# Patient Record
Sex: Male | Born: 1980
Health system: Southern US, Community
[De-identification: ages and names within clinical notes are randomized; demographics above are authoritative.]

## PROBLEM LIST (undated history)

## (undated) DIAGNOSIS — G935 Compression of brain: Secondary | ICD-10-CM

## (undated) DIAGNOSIS — J302 Other seasonal allergic rhinitis: Secondary | ICD-10-CM

## (undated) HISTORY — DX: Other seasonal allergic rhinitis: J30.2

## (undated) HISTORY — PX: OTHER SURGICAL HISTORY: SHX169

## (undated) HISTORY — DX: Compression of brain: G93.5

---

## 2004-04-15 HISTORY — PX: LEG OSTEOTOMY: SHX1001

## 2012-11-26 ENCOUNTER — Ambulatory Visit: Payer: Self-pay | Admitting: Family Medicine

## 2014-01-18 ENCOUNTER — Ambulatory Visit: Payer: Self-pay | Admitting: Ophthalmology

## 2014-06-07 ENCOUNTER — Ambulatory Visit: Payer: Self-pay | Admitting: Family Medicine

## 2014-12-27 ENCOUNTER — Encounter: Payer: Self-pay | Admitting: Family Medicine

## 2014-12-27 ENCOUNTER — Ambulatory Visit (INDEPENDENT_AMBULATORY_CARE_PROVIDER_SITE_OTHER): Payer: 59 | Admitting: Family Medicine

## 2014-12-27 VITALS — BP 102/60 | HR 72 | Temp 98.1°F | Resp 16 | Ht 60.0 in | Wt 173.0 lb

## 2014-12-27 DIAGNOSIS — K047 Periapical abscess without sinus: Secondary | ICD-10-CM

## 2014-12-27 MED ORDER — AMOXICILLIN-POT CLAVULANATE 875-125 MG PO TABS: 1.0000 | ORAL_TABLET | Freq: Two times a day (BID) | ORAL | Status: DC

## 2014-12-27 NOTE — Progress Notes (Signed)
Subjective:     Patient ID: Brent Ruiz, male   DOB: [birth year], 34 y.o.   MRN: [id number]  HPI  Chief Complaint  Patient presents with  . Oral Pain  States he noticed it first on 9/10 in his right upper molar area then noticed accompanying right facial pain. This has responded to Aleve. States he has had a tooth abscess in the past and it feels similar. States he has had frequent dental visits in the past for replacement of fillings and a root canal.   Review of Systems  Constitutional: Negative for fever and chills.       Objective:   Physical Exam  Constitutional: He appears well-developed and well-nourished. No distress.  HENT:  Mouth/Throat:    Tenderness and mild swelling of gum tissue around right upper posterior molar. No erythema or drainage noted.       Assessment:    1. Tooth abscess - amoxicillin-clavulanate (AUGMENTIN) 875-125 MG per tablet; Take 1 tablet by mouth 2 (two) times daily.  Dispense: 20 tablet; Refill: 0    Plan:   Recommended f/u with his dentist once swelling and pain has improved or sooner if worsening.

## 2014-12-27 NOTE — Patient Instructions (Signed)
Please update with your dentist regarding possible tooth abscess.

## 2015-03-28 ENCOUNTER — Encounter: Payer: Self-pay | Admitting: Family Medicine

## 2015-03-28 ENCOUNTER — Ambulatory Visit (INDEPENDENT_AMBULATORY_CARE_PROVIDER_SITE_OTHER): Payer: 59 | Admitting: Family Medicine

## 2015-03-28 VITALS — BP 90/50 | HR 89 | Temp 97.7°F | Resp 16 | Wt 172.2 lb

## 2015-03-28 DIAGNOSIS — J019 Acute sinusitis, unspecified: Secondary | ICD-10-CM | POA: Diagnosis not present

## 2015-03-28 DIAGNOSIS — J302 Other seasonal allergic rhinitis: Secondary | ICD-10-CM | POA: Insufficient documentation

## 2015-03-28 DIAGNOSIS — G935 Compression of brain: Secondary | ICD-10-CM | POA: Insufficient documentation

## 2015-03-28 DIAGNOSIS — M25511 Pain in right shoulder: Secondary | ICD-10-CM

## 2015-03-28 DIAGNOSIS — M25512 Pain in left shoulder: Secondary | ICD-10-CM

## 2015-03-28 MED ORDER — MELOXICAM 15 MG PO TABS: 15.0000 mg | ORAL_TABLET | Freq: Every day | ORAL | Status: DC

## 2015-03-28 MED ORDER — HYDROCODONE-HOMATROPINE 5-1.5 MG/5ML PO SYRP: ORAL_SOLUTION | ORAL | Status: DC

## 2015-03-28 MED ORDER — AMOXICILLIN-POT CLAVULANATE 875-125 MG PO TABS: 1.0000 | ORAL_TABLET | Freq: Two times a day (BID) | ORAL | Status: DC

## 2015-03-28 NOTE — Patient Instructions (Signed)
Continue decongestants/cold medication like Mucinex D. Delsym may also help hour cough during the day.

## 2015-03-28 NOTE — Progress Notes (Signed)
Subjective:     Patient ID: Brent Ruiz, male   DOB: 01/31/1981, 34 y.o.   MRN: [id number]  HPI  Chief Complaint  Patient presents with  . Sinus Problem    Patient comes in office today with concerns of sinus pain and pressure for the past 2 weeks. Patient reports productive cough of yellow/green phlegm, post nasal drip, and sinus headache. Patient reports that he is having a hard time sleeping through the night, ringing in the ears and has heaviness on his chest. He has tried otc Delsym and [facility] brand Sinus pressure tablet  Patient reports increased sinus headaches, purulent  post nasal drainage and accompanying cough   Review of Systems  Constitutional: Negative for fever and chills.  Musculoskeletal:       Has recurrence of shoulder pain for which he has seen orthopedics,Dr. Martha ClanKrasinski, in the past. Wishes to resume meloxicam.       Objective:   Physical Exam  Constitutional: He appears well-developed and well-nourished. No distress.  Ears: T.M's intact without inflammation Sinuses: non-tender Throat: no tonsillar enlargement or exudate Neck: no cervical adenopathy Lungs: clear     Assessment:    1. Acute sinusitis, recurrence not specified, unspecified location - amoxicillin-clavulanate (AUGMENTIN) 875-125 MG tablet; Take 1 tablet by mouth 2 (two) times daily.  Dispense: 20 tablet; Refill: 0 - HYDROcodone-homatropine (HYCODAN) 5-1.5 MG/5ML syrup; 5 ml 4-6 hours as needed for cough  Dispense: 240 mL; Refill: 0  2. Shoulder pain, bilateral - meloxicam (MOBIC) 15 MG tablet; Take 1 tablet (15 mg total) by mouth daily.  Dispense: 30 tablet; Refill: 1    Plan:    Will refer back to orthopedics if not improving.

## 2015-07-05 ENCOUNTER — Encounter: Payer: Self-pay | Admitting: Family Medicine

## 2015-07-05 ENCOUNTER — Ambulatory Visit (INDEPENDENT_AMBULATORY_CARE_PROVIDER_SITE_OTHER): Payer: 59 | Admitting: Family Medicine

## 2015-07-05 VITALS — BP 92/56 | HR 81 | Temp 97.9°F | Resp 16 | Wt 168.6 lb

## 2015-07-05 DIAGNOSIS — R1031 Right lower quadrant pain: Secondary | ICD-10-CM

## 2015-07-05 LAB — POCT URINALYSIS DIPSTICK
Bilirubin, UA: NEGATIVE
Glucose, UA: NEGATIVE
Ketones, UA: NEGATIVE
Leukocytes, UA: NEGATIVE
Nitrite, UA: NEGATIVE
PROTEIN UA: NEGATIVE
SPEC GRAV UA: 1.015
UROBILINOGEN UA: 0.2
pH, UA: 5

## 2015-07-05 NOTE — Patient Instructions (Signed)
Resume meloxicam and see it it helps.

## 2015-07-05 NOTE — Progress Notes (Signed)
Subjective:     Patient ID: Brent Ruiz, male   DOB: 08/23/1980, 35 y.o.   MRN: [id number]  HPI  Chief Complaint  Patient presents with  . Groin Pain    Patient comes in office today with concerns of groin pain on right side radiating down to the right testicle for the past week. Patient describes pain as sharp and notices it more when he is active or walking around.   States he feels the 2/10 pain 3 x day for 5 minutes.States the throbbing pain radiates to his right testicle. Denies injury. Has hx of kidney stone which he passed spontaneously.Not sexually active.   Review of Systems  Constitutional: Negative for fever and chills.  Gastrointestinal: Negative for constipation.  Genitourinary: Negative for dysuria.       Objective:   Physical Exam  Constitutional: He appears well-developed and well-nourished. No distress.  Abdominal: Soft. There is no tenderness.  Genitourinary:  Prostate non-tender, non enlarged. No testicle swelling or epididymal tenderness       Assessment:    1. Groin pain, right - POCT urinalysis dipstick    Plan:    Try meloxicam. If not improving consider x-ray.

## 2015-07-13 ENCOUNTER — Encounter: Payer: Self-pay | Admitting: Physician Assistant

## 2015-07-13 ENCOUNTER — Ambulatory Visit (INDEPENDENT_AMBULATORY_CARE_PROVIDER_SITE_OTHER): Payer: 59 | Admitting: Physician Assistant

## 2015-07-13 VITALS — BP 114/62 | HR 118 | Temp 102.8°F | Resp 18 | Wt 168.6 lb

## 2015-07-13 DIAGNOSIS — R6889 Other general symptoms and signs: Secondary | ICD-10-CM | POA: Diagnosis not present

## 2015-07-13 DIAGNOSIS — J029 Acute pharyngitis, unspecified: Secondary | ICD-10-CM | POA: Diagnosis not present

## 2015-07-13 DIAGNOSIS — J111 Influenza due to unidentified influenza virus with other respiratory manifestations: Secondary | ICD-10-CM | POA: Diagnosis not present

## 2015-07-13 LAB — POCT INFLUENZA A/B
INFLUENZA A, POC: NEGATIVE
Influenza B, POC: NEGATIVE

## 2015-07-13 LAB — POCT RAPID STREP A (OFFICE): Rapid Strep A Screen: NEGATIVE

## 2015-07-13 MED ORDER — OSELTAMIVIR PHOSPHATE 75 MG PO CAPS: 75.0000 mg | ORAL_CAPSULE | Freq: Two times a day (BID) | ORAL | Status: DC

## 2015-07-13 NOTE — Progress Notes (Signed)
Patient: Brent Ruiz Male    DOB: 07/30/1980   35 y.o.   MRN: [id number] Visit Date: 07/13/2015  Today's Provider: Margaretann LovelessJennifer M Burnette, PA-C   Chief Complaint  Patient presents with  . URI   Subjective:    URI  This is a new problem. The current episode started yesterday (Cough started yesterday evening together with the Fever). The problem has been gradually worsening. Maximum temperature: Didn't measured his temperature. Was just feeling cold and hot. The fever has been present for 1 to 2 days. Associated symptoms include congestion, coughing (phlegm from the cough had a "little blood in it last night from the sinuses"), headaches, joint pain (aching all over), a plugged ear sensation, rhinorrhea, sinus pain and a sore throat. Pertinent negatives include no abdominal pain, chest pain, diarrhea, dysuria, ear pain, nausea, neck pain, rash, sneezing, vomiting or wheezing. He has tried acetaminophen and decongestant (Ibuprofen) for the symptoms. The treatment provided no relief.   He has had his coworker at work be out sick over the last couple days with a fever as well. He was not told of his coworker had the flu or not. They do work in very close proximity.    Allergies  Allergen Reactions  . Eggs Or Egg-Derived Products Nausea Only   Previous Medications   MELOXICAM (MOBIC) 15 MG TABLET    Take 1 tablet (15 mg total) by mouth daily.    Review of Systems  Constitutional: Positive for fever, chills and fatigue.  HENT: Positive for congestion, postnasal drip, rhinorrhea, sinus pressure and sore throat. Negative for ear pain, sneezing and trouble swallowing.   Respiratory: Positive for cough (phlegm from the cough had a "little blood in it last night from the sinuses"). Negative for chest tightness, shortness of breath and wheezing.   Cardiovascular: Negative for chest pain, palpitations and leg swelling.  Gastrointestinal: Negative for nausea, vomiting, abdominal pain and  diarrhea.  Genitourinary: Negative for dysuria.  Musculoskeletal: Positive for myalgias, joint pain (aching all over) and arthralgias. Negative for neck pain and neck stiffness.  Skin: Negative for rash.  Neurological: Positive for headaches. Negative for dizziness.    Social History  Substance Use Topics  . Smoking status: Never Smoker   . Smokeless tobacco: Never Used  . Alcohol Use: No   Objective:   BP 114/62 mmHg  Pulse 118  Temp(Src) 102.8 F (39.3 C) (Oral)  Resp 18  Wt 168 lb 9.6 oz (76.476 kg)  SpO2 97%  Physical Exam  Constitutional: He appears well-developed and well-nourished. No distress.  HENT:  Head: Normocephalic and atraumatic.  Right Ear: Hearing, external ear and ear canal normal. Tympanic membrane is not erythematous and not bulging. A middle ear effusion is present.  Left Ear: Hearing, external ear and ear canal normal. Tympanic membrane is not erythematous and not bulging. A middle ear effusion is present.  Nose: Mucosal edema and rhinorrhea present. Right sinus exhibits no maxillary sinus tenderness and no frontal sinus tenderness. Left sinus exhibits no maxillary sinus tenderness and no frontal sinus tenderness.  Mouth/Throat: Uvula is midline and mucous membranes are normal. Posterior oropharyngeal erythema present. No oropharyngeal exudate or posterior oropharyngeal edema.  Eyes: Conjunctivae and EOM are normal. Pupils are equal, round, and reactive to light. Right eye exhibits no discharge. Left eye exhibits no discharge.  Neck: Normal range of motion. Neck supple. No tracheal deviation present. No Brudzinski's sign and no Kernig's sign noted. No thyromegaly present.  Cardiovascular:  Normal rate, regular rhythm and normal heart sounds.  Exam reveals no gallop and no friction rub.   No murmur heard. Pulmonary/Chest: Effort normal and breath sounds normal. No stridor. No respiratory distress. He has no wheezes. He has no rales.  Lymphadenopathy:    He has  no cervical adenopathy.  Skin: Skin is warm and dry. He is not diaphoretic.  Vitals reviewed.       Assessment & Plan:     1. Flu Worsening symptoms with high fever. I will treat with Tamiflu as below the regular flu test was negative today in the office secondary to symptoms and prevalence of flu in the area. I did advise him to continue alternating between Tylenol and ibuprofen for fevers. He said may sure to stay very well hydrated and get plenty of rest. He is to call the office if symptoms fail to improve or worsen. - oseltamivir (TAMIFLU) 75 MG capsule; Take 1 capsule (75 mg total) by mouth 2 (two) times daily.  Dispense: 10 capsule; Refill: 0  2. Sore throat Rapid strep test was negative. See above medical treatment plan. - POCT rapid strep A - oseltamivir (TAMIFLU) 75 MG capsule; Take 1 capsule (75 mg total) by mouth 2 (two) times daily.  Dispense: 10 capsule; Refill: 0  3. Flu-like symptoms Flu test was negative. See above medical treatment plan. - POCT Influenza A/B - oseltamivir (TAMIFLU) 75 MG capsule; Take 1 capsule (75 mg total) by mouth 2 (two) times daily.  Dispense: 10 capsule; Refill: 0       Margaretann Loveless, PA-C  [facility]

## 2015-07-13 NOTE — Patient Instructions (Signed)

## 2015-07-15 ENCOUNTER — Ambulatory Visit (INDEPENDENT_AMBULATORY_CARE_PROVIDER_SITE_OTHER): Payer: 59 | Admitting: Family Medicine

## 2015-07-15 ENCOUNTER — Encounter: Payer: Self-pay | Admitting: Family Medicine

## 2015-07-15 VITALS — BP 102/62 | HR 92 | Temp 99.8°F | Resp 16 | Wt 166.0 lb

## 2015-07-15 DIAGNOSIS — J019 Acute sinusitis, unspecified: Secondary | ICD-10-CM | POA: Insufficient documentation

## 2015-07-15 DIAGNOSIS — J302 Other seasonal allergic rhinitis: Secondary | ICD-10-CM | POA: Diagnosis not present

## 2015-07-15 DIAGNOSIS — R509 Fever, unspecified: Secondary | ICD-10-CM

## 2015-07-15 DIAGNOSIS — J0110 Acute frontal sinusitis, unspecified: Secondary | ICD-10-CM

## 2015-07-15 MED ORDER — AMOXICILLIN-POT CLAVULANATE 875-125 MG PO TABS
1.0000 | ORAL_TABLET | Freq: Two times a day (BID) | ORAL | Status: DC
Start: 2015-07-15 — End: 2015-08-08

## 2015-07-15 MED ORDER — FLUTICASONE PROPIONATE 50 MCG/ACT NA SUSP: 2.0000 | Freq: Every day | NASAL | Status: DC

## 2015-07-15 NOTE — Progress Notes (Signed)
Patient: Brent Ruiz Male    DOB: 04/19/1980   35 y.o.   MRN: [id number] Visit Date: 07/15/2015  Today's Provider: Lorie PhenixNancy Benicio Manna, MD   Chief Complaint  Patient presents with  . Cough  . Fever   Subjective:    HPI  Patient saw Brent Ruiz 07/13/15 for flu like symptoms. Treated for flu with tamiflu and advised to stay hydrated. Patient was advised to return if symptoms have not improved. Patient reports that he still has symptoms of body aches and fever 100.5.  Also, now with bloody nasal drainage and coughing up brown phlegm. Also, with headache on the top of his head.  Does have history of sinus infection. Concerned about the phlegm.  Also, has back pain with cough, not with breathing. Bilateral.    Allergies  Allergen Reactions  . Eggs Or Egg-Derived Products Nausea Only   Previous Medications   MELOXICAM (MOBIC) 15 MG TABLET    Take 1 tablet (15 mg total) by mouth daily.   OSELTAMIVIR (TAMIFLU) 75 MG CAPSULE    Take 1 capsule (75 mg total) by mouth 2 (two) times daily.    Review of Systems  Constitutional: Positive for fatigue. Negative for fever, chills and appetite change.  HENT: Positive for congestion, nosebleeds, postnasal drip, rhinorrhea, sinus pressure and sore throat.   Eyes: Negative for discharge.  Respiratory: Positive for cough. Negative for chest tightness, shortness of breath and wheezing.   Cardiovascular: Negative for chest pain and palpitations.  Gastrointestinal: Negative for nausea, vomiting and abdominal pain.    Social History  Substance Use Topics  . Smoking status: Never Smoker   . Smokeless tobacco: Never Used  . Alcohol Use: No   Objective:   BP 102/62 mmHg  Pulse 92  Temp(Src) 99.8 F (37.7 C) (Oral)  Resp 16  Wt 166 lb (75.297 kg)  SpO2 97%  Physical Exam  Constitutional: He is oriented to person, place, and time. He appears well-developed and well-nourished.  HENT:  Head: Normocephalic and atraumatic.  Right Ear:  Tympanic membrane and external ear normal.  Left Ear: Tympanic membrane and external ear normal.  Nose: Mucosal edema and rhinorrhea present. Right sinus exhibits no maxillary sinus tenderness and no frontal sinus tenderness. Left sinus exhibits no maxillary sinus tenderness and no frontal sinus tenderness.  Mouth/Throat: Uvula is midline and oropharynx is clear and moist.  Eyes: Conjunctivae and EOM are normal. Pupils are equal, round, and reactive to light.  Neck: Normal range of motion. Neck supple.  Cardiovascular: Normal rate and regular rhythm.   Pulmonary/Chest: Effort normal and breath sounds normal. He has no wheezes. He has no rales.  Neurological: He is alert and oriented to person, place, and time.      Assessment & Plan:     1. Acute frontal sinusitis, recurrence not specified New problem. Presumed secondary infection related to flu Will start antibiotic.  Patient instructed to call back if condition worsens or does not improve.    - amoxicillin-clavulanate (AUGMENTIN) 875-125 MG tablet; Take 1 tablet by mouth 2 (two) times daily.  Dispense: 20 tablet; Refill: 0  2. Other specified fever Suspect related to flu. Finish Tamiflu.   3. Allergic rhinitis, seasonal Is complaining of ear congestion and popping. Recommend start Flonase NS. OV if does not improve.   - fluticasone (FLONASE) 50 MCG/ACT nasal spray; Place 2 sprays into both nostrils daily.  Dispense: 16 g; Refill: 6     Lorie PhenixNancy Shekira Drummer, MD  [facility]

## 2015-08-08 ENCOUNTER — Ambulatory Visit (INDEPENDENT_AMBULATORY_CARE_PROVIDER_SITE_OTHER): Payer: 59 | Admitting: Family Medicine

## 2015-08-08 ENCOUNTER — Encounter: Payer: Self-pay | Admitting: Family Medicine

## 2015-08-08 VITALS — BP 104/64 | HR 81 | Temp 97.8°F | Resp 16 | Wt 165.6 lb

## 2015-08-08 DIAGNOSIS — J302 Other seasonal allergic rhinitis: Secondary | ICD-10-CM | POA: Diagnosis not present

## 2015-08-08 NOTE — Patient Instructions (Signed)
Start Claritin D and Flonase spray. Let me know if sinuses not improving by the end of the week. Use of a Neti Pot ok.

## 2015-08-08 NOTE — Progress Notes (Signed)
Subjective:     Patient ID: Brent Ruiz, male   DOB: 07/19/1980, 35 y.o.   MRN: [id number]  HPI  Chief Complaint  Patient presents with  . Sinus Problem    Patient comes in office today with concerns of sinus pain and pressure since 4/20. Associated with sinus pain patient reports; productive cough with phlegm, chest congestion, headache and bloody nose. Patient has been taking otc Mucinex and his prescription Hydrocodone.   States his eyes and throat are itchy: "I don't feel sick". Reports he usually has allergy symptoms this time of year. Sinus drainage and sputum are primarily clear.   Review of Systems     Objective:   Physical Exam  Constitutional: He appears well-developed and well-nourished. No distress.  Ears: T.M's intact without inflammation/increased cerumen in his left eary Throat: no tonsillar enlargement or exudate Neck: no cervical adenopathy Lungs: clear     Assessment:    1. Allergic rhinitis, seasonal    Plan:   Start Clartin D and Flonase spray. Add Neti Pot as tolerated. He is to let me know if sinuses not improving over the course of the week.

## 2015-11-14 ENCOUNTER — Ambulatory Visit: Payer: 59 | Admitting: Family Medicine

## 2015-11-16 ENCOUNTER — Telehealth: Payer: Self-pay

## 2015-11-16 ENCOUNTER — Encounter: Payer: Self-pay | Admitting: Family Medicine

## 2015-11-16 ENCOUNTER — Ambulatory Visit (INDEPENDENT_AMBULATORY_CARE_PROVIDER_SITE_OTHER): Payer: 59 | Admitting: Family Medicine

## 2015-11-16 ENCOUNTER — Ambulatory Visit
Admission: RE | Admit: 2015-11-16 | Discharge: 2015-11-16 | Disposition: A | Discharge status: Home / Self Care | Payer: 59 | Source: Ambulatory Visit | Attending: Family Medicine | Admitting: Family Medicine

## 2015-11-16 VITALS — BP 100/64 | HR 84 | Temp 97.9°F | Resp 16 | Wt 167.6 lb

## 2015-11-16 DIAGNOSIS — M25522 Pain in left elbow: Secondary | ICD-10-CM | POA: Diagnosis not present

## 2015-11-16 DIAGNOSIS — M25512 Pain in left shoulder: Secondary | ICD-10-CM | POA: Insufficient documentation

## 2015-11-16 DIAGNOSIS — K5900 Constipation, unspecified: Secondary | ICD-10-CM

## 2015-11-16 NOTE — Progress Notes (Signed)
Subjective:     Patient ID: Brent Ruiz, male   DOB: 03-24-1981, 35 y.o.   MRN: [id number]  HPI  Chief Complaint  Patient presents with  . Fall    Patient comes in office today with concerns of a fall that happened three weeks ago. Patient states that he was walking down stairs and slipping on rug on stairs falling and landing on his left arm. Patient reports elbow as being tender to the touch and difficulty lifting objects or doing simple task such as turning a door knob.   Also states the tip of his shoulder is sore as well. States he bumped down several carpeted steps when he fell. Also has occasional constipation and has tried the appropriate products like Benefiber and Miralax. Normal pattern is daily. Also has a spot on his testicle he wishes checking.   Review of Systems     Objective:   Physical Exam  Constitutional: He appears well-developed and well-nourished. No distress.  Genitourinary:  Genitourinary Comments: 1 mm skin tag noted on right testicle. No epididymal tenderness or difficulty retracting his foreskin.  Musculoskeletal:  Tender over his left olecranon and tip of his left acromion. EF/EE/Shoulder 5/5. Increased pain in his shoulder with active flexion > 90 degrees.Full PROM without pain. No elbow swelling noted.       Assessment:    1. Constipation, unspecified constipation type  2. Left elbow pain - DG Elbow Complete Left; Future  3. Left shoulder pain - DG Shoulder Left; Future    Plan:    Discussed scheduling nsaid's pending x-ray report. Continue current otc choices for constipation.

## 2015-11-16 NOTE — Telephone Encounter (Signed)
Let's see if both pains are helped by scheduling Aleve.

## 2015-11-16 NOTE — Patient Instructions (Addendum)
We will call you with the x-ray results. Try two Aleve twice daily with food for at least a week. Continue with fiber or Miralax for constipation.

## 2015-11-16 NOTE — Telephone Encounter (Signed)
Patient has been advised. KW 

## 2015-11-16 NOTE — Telephone Encounter (Signed)
-----   Message from Anola Gurney, [place] sent at 11/16/2015  9:50 AM EDT ----- No elbow fracture. L upper arm x-ray shows chronic deformity ? Old fracture. Have you hurt this arm before?

## 2015-11-16 NOTE — Telephone Encounter (Signed)
Spoke with patient on the phone and advised him of report, he states that he has no history of previous injury or fracture to left arm, just bursitis in his right

## 2015-12-06 DIAGNOSIS — R200 Anesthesia of skin: Secondary | ICD-10-CM | POA: Insufficient documentation

## 2015-12-06 DIAGNOSIS — N50819 Testicular pain, unspecified: Secondary | ICD-10-CM | POA: Insufficient documentation

## 2016-03-11 DIAGNOSIS — R102 Pelvic and perineal pain unspecified side: Secondary | ICD-10-CM | POA: Insufficient documentation

## 2016-12-19 ENCOUNTER — Ambulatory Visit (INDEPENDENT_AMBULATORY_CARE_PROVIDER_SITE_OTHER): Payer: 59 | Admitting: Family Medicine

## 2016-12-19 ENCOUNTER — Encounter: Payer: Self-pay | Admitting: Family Medicine

## 2016-12-19 VITALS — BP 96/60 | HR 68 | Temp 98.0°F | Resp 16 | Wt 147.0 lb

## 2016-12-19 DIAGNOSIS — J302 Other seasonal allergic rhinitis: Secondary | ICD-10-CM

## 2016-12-19 MED ORDER — CETIRIZINE HCL 10 MG PO TABS: 10.0000 mg | ORAL_TABLET | Freq: Every day | ORAL | 11 refills | Status: DC

## 2016-12-19 NOTE — Patient Instructions (Signed)
Allergic Rhinitis Allergic rhinitis is when the mucous membranes in the nose respond to allergens. Allergens are particles in the air that cause your body to have an allergic reaction. This causes you to release allergic antibodies. Through a chain of events, these eventually cause you to release histamine into the blood stream. Although meant to protect the body, it is this release of histamine that causes your discomfort, such as frequent sneezing, congestion, and an itchy, runny nose. What are the causes? Seasonal allergic rhinitis (hay fever) is caused by pollen allergens that may come from grasses, trees, and weeds. Year-round allergic rhinitis (perennial allergic rhinitis) is caused by allergens such as house dust mites, pet dander, and mold spores. What are the signs or symptoms?  Nasal stuffiness (congestion).  Itchy, runny nose with sneezing and tearing of the eyes. How is this diagnosed? Your health care provider can help you determine the allergen or allergens that trigger your symptoms. If you and your health care provider are unable to determine the allergen, skin or blood testing may be used. Your health care provider will diagnose your condition after taking your health history and performing a physical exam. Your health care provider may assess you for other related conditions, such as asthma, pink eye, or an ear infection. How is this treated? Allergic rhinitis does not have a cure, but it can be controlled by:  Medicines that block allergy symptoms. These may include allergy shots, nasal sprays, and oral antihistamines.  Avoiding the allergen. Hay fever may often be treated with antihistamines in pill or nasal spray forms. Antihistamines block the effects of histamine. There are over-the-counter medicines that may help with nasal congestion and swelling around the eyes. Check with your health care provider before taking or giving this medicine. If avoiding the allergen or the  medicine prescribed do not work, there are many new medicines your health care provider can prescribe. Stronger medicine may be used if initial measures are ineffective. Desensitizing injections can be used if medicine and avoidance does not work. Desensitization is when a patient is given ongoing shots until the body becomes less sensitive to the allergen. Make sure you follow up with your health care provider if problems continue. Follow these instructions at home: It is not possible to completely avoid allergens, but you can reduce your symptoms by taking steps to limit your exposure to them. It helps to know exactly what you are allergic to so that you can avoid your specific triggers. Contact a health care provider if:  You have a fever.  You develop a cough that does not stop easily (persistent).  You have shortness of breath.  You start wheezing.  Symptoms interfere with normal daily activities. This information is not intended to replace advice given to you by your health care provider. Make sure you discuss any questions you have with your health care provider. Document Released: 12/25/2000 Document Revised: 12/01/2015 Document Reviewed: 12/07/2012 Elsevier Interactive Patient Education  2017 [employer]  

## 2016-12-19 NOTE — Assessment & Plan Note (Signed)
Symptoms c/w allergic rhinitis No evidence of acute bacterial sinusitis, pneumonia, or AOM Discussed regular use of flonase and 2nd gen antihistamine May also use benadryl prn Can use cepachol for throat irritation Return precautions discussed

## 2016-12-19 NOTE — Progress Notes (Signed)
Patient: Brent Ruiz Male    DOB: November 28, 1980   36 y.o.   MRN: [id number] Visit Date: 12/19/2016  Today's Provider: Shirlee Latch, MD   Chief Complaint  Patient presents with  . Sinusitis   Subjective:    Sinusitis  This is a new problem. Episode onset: x 1 week. The problem is unchanged. There has been no fever. He is experiencing no pain. Associated symptoms include congestion, coughing (has noticed green sputum as well as blood), headaches, a hoarse voice, sneezing, a sore throat (related to post nasal drip) and swollen glands. Pertinent negatives include no chills, diaphoresis, ear pain, neck pain, shortness of breath or sinus pressure. (Post nasal drip) Treatments tried: Flonase, DayQuil Cold and Flu, Cepacol cough drops, honey with lime and ginger  The treatment provided mild relief.   Worse at night No known sick contacts Wonders if this may be related to buying new car and previous owner was a smoker. Does have seasonal allergies    Allergies  Allergen Reactions  . Eggs Or Egg-Derived Products Nausea Only     Current Outpatient Prescriptions:  .  fluticasone (FLONASE) 50 MCG/ACT nasal spray, Place 2 sprays into both nostrils daily., Disp: 16 g, Rfl: 6  Review of Systems  Constitutional: Negative for chills and diaphoresis.  HENT: Positive for congestion, hoarse voice, sneezing and sore throat (related to post nasal drip). Negative for ear pain and sinus pressure.   Respiratory: Positive for cough (has noticed green sputum as well as blood). Negative for shortness of breath.   Musculoskeletal: Negative for neck pain.  Neurological: Positive for headaches.    Social History  Substance Use Topics  . Smoking status: Never Smoker  . Smokeless tobacco: Never Used  . Alcohol use No   Objective:   BP 96/60 (BP Location: Left Arm, Patient Position: Sitting, Cuff Size: Normal)   Pulse 68   Temp 98 F (36.7 C) (Oral)   Resp 16   Wt 147 lb (66.7 kg)   BMI 28.71  kg/m  Vitals:   12/19/16 0818  BP: 96/60  Pulse: 68  Resp: 16  Temp: 98 F (36.7 C)  TempSrc: Oral  Weight: 147 lb (66.7 kg)     Physical Exam  Constitutional: He is oriented to person, place, and time. He appears well-developed and well-nourished. No distress.  HENT:  Head: Normocephalic and atraumatic.  Right Ear: Tympanic membrane, external ear and ear canal normal.  Left Ear: Tympanic membrane, external ear and ear canal normal.  Nose: Mucosal edema present. Right sinus exhibits no maxillary sinus tenderness and no frontal sinus tenderness. Left sinus exhibits no maxillary sinus tenderness and no frontal sinus tenderness.  Mouth/Throat: Uvula is midline and mucous membranes are normal. Posterior oropharyngeal erythema (mild) present. No oropharyngeal exudate.  Eyes: Pupils are equal, round, and reactive to light. Conjunctivae are normal. No scleral icterus.  Neck: Neck supple. No thyromegaly present.  Cardiovascular: Normal rate, regular rhythm, normal heart sounds and intact distal pulses.   No murmur heard. Pulmonary/Chest: Effort normal and breath sounds normal. No respiratory distress. He has no wheezes. He has no rales.  Musculoskeletal: He exhibits no edema or deformity.  Lymphadenopathy:    He has no cervical adenopathy.  Neurological: He is alert and oriented to person, place, and time.  Skin: Skin is warm and dry. No rash noted.  Psychiatric: He has a normal mood and affect. His behavior is normal.  Vitals reviewed.      Assessment &  Plan:     Allergic rhinitis, seasonal Symptoms c/w allergic rhinitis No evidence of acute bacterial sinusitis, pneumonia, or AOM Discussed regular use of flonase and 2nd gen antihistamine May also use benadryl prn Can use cepachol for throat irritation Return precautions discussed      The entirety of the information documented in the History of Present Illness, Review of Systems and Physical Exam were personally obtained by  me. Portions of this information were initially documented by Irving BurtonEmily Ratchford, CMA and reviewed by me for thoroughness and accuracy.     Shirlee LatchAngela Sharleen Szczesny, MD  [facility]

## 2017-03-20 ENCOUNTER — Ambulatory Visit (INDEPENDENT_AMBULATORY_CARE_PROVIDER_SITE_OTHER): Payer: 59 | Admitting: Family Medicine

## 2017-03-20 ENCOUNTER — Encounter: Payer: Self-pay | Admitting: Family Medicine

## 2017-03-20 VITALS — BP 130/82 | HR 68 | Temp 98.1°F | Resp 15 | Wt 150.8 lb

## 2017-03-20 DIAGNOSIS — R300 Dysuria: Secondary | ICD-10-CM | POA: Diagnosis not present

## 2017-03-20 LAB — POCT URINALYSIS DIPSTICK
Bilirubin, UA: NEGATIVE
Blood, UA: NEGATIVE
GLUCOSE UA: NEGATIVE
Ketones, UA: NEGATIVE
Leukocytes, UA: NEGATIVE
NITRITE UA: NEGATIVE
PROTEIN UA: NEGATIVE
Spec Grav, UA: <=1.005 — AB (ref 1.010–1.025)
UROBILINOGEN UA: 0.2 U/dL
pH, UA: 6 (ref 5.0–8.0)

## 2017-03-20 MED ORDER — DOXYCYCLINE HYCLATE 100 MG PO TABS: 100.0000 mg | ORAL_TABLET | Freq: Two times a day (BID) | ORAL | 0 refills | Status: DC

## 2017-03-20 NOTE — Progress Notes (Signed)
Subjective:     Patient ID: Derrel Nipaymeon Shands, male   DOB: 01/26/1981, 36 y.o.   MRN: [id number] Chief Complaint  Patient presents with  . Dysuria    Patient comes in office with complaints of dysuria for the past 3 days.    HPI Reports mild urinary burning and frequency. Not sexually active. Reports he does not usually drink a lot of caffeinated beverages but did eat a lot of chocolate earlier this week. Denies constipation. States he will be traveling to [place] to visit his wife over the holidays. Has had prior urological evaluation last year for scrotal paresthesias felt associated with his CNS malformation.  Review of Systems     Objective:   Physical Exam  Constitutional: He appears well-developed and well-nourished. No distress.  Genitourinary:  Genitourinary Comments: Prostate non-tender and not enlarged.       Assessment:    1. Dysuria: rx for doxycycline 100 mg twice daily #20. - POCT urinalysis dipstick    Plan:   Discussed increased fluids and no caffeine use for 2-3 days. If not improving to start the abx to cover for ? prostatitis

## 2017-03-20 NOTE — Patient Instructions (Signed)
Avoid caffeine and push fluids for 2-3 days. If not  Improving start the antibiotic

## 2017-03-27 ENCOUNTER — Ambulatory Visit (INDEPENDENT_AMBULATORY_CARE_PROVIDER_SITE_OTHER): Payer: 59 | Admitting: Family Medicine

## 2017-03-27 ENCOUNTER — Encounter: Payer: Self-pay | Admitting: Family Medicine

## 2017-03-27 VITALS — BP 110/70 | HR 82 | Temp 98.1°F | Resp 16 | Wt 150.8 lb

## 2017-03-27 DIAGNOSIS — M545 Low back pain, unspecified: Secondary | ICD-10-CM

## 2017-03-27 DIAGNOSIS — K5900 Constipation, unspecified: Secondary | ICD-10-CM

## 2017-03-27 LAB — POCT URINALYSIS DIPSTICK
BILIRUBIN UA: NEGATIVE
Blood, UA: NEGATIVE
Glucose, UA: NEGATIVE
KETONES UA: NEGATIVE
Leukocytes, UA: NEGATIVE
Nitrite, UA: NEGATIVE
PH UA: 5 (ref 5.0–8.0)
Protein, UA: NEGATIVE
Spec Grav, UA: <=1.005 — AB (ref 1.010–1.025)
UROBILINOGEN UA: 0.2 U/dL

## 2017-03-27 NOTE — Progress Notes (Signed)
Subjective:     Patient ID: Brent Ruiz, male   DOB: 09/20/1980, 36 y.o.   MRN: [id number] Chief Complaint  Patient presents with  . Urinary Frequency    Patient returns back to office for follow up after being seen on 03/20/17 for dysuria, patient states that symptoms have not improved and in addition he has had body chills, and lower right back pain.    HPI States he has had a kidney stone 10 years ago but this back pain not similar. Completing abx for dysuria and states burning has resolved but still feels he has to "push" the urine out. Also reports constipation with straining at times.  Review of Systems     Objective:   Physical Exam  Constitutional: He appears well-developed and well-nourished. No distress.  Musculoskeletal:  Muscle strength in lower extremities 5/5. SLR's to 90 degrees without radiation of back pain. Localizes to his right lumbar paravertebral area.       Assessment:    1. Acute right-sided low back pain without sciatica - POCT urinalysis dipstick  2. Constipation, unspecified constipation type    Plan:    Complete abx., Start Aleve for back pain. Increase fiber in your diet. Consider neuro  Referral due to hx of Arnold-Chiari hx.

## 2017-03-27 NOTE — Patient Instructions (Addendum)
Discussed use of adding fiber to the diet, Miralax if constipated. Start Aleve two pills twice daily for back pain. Complete antibiotic. If not improving-call for neurology referral.

## 2017-06-11 ENCOUNTER — Other Ambulatory Visit: Payer: Self-pay | Admitting: Family Medicine

## 2017-06-11 ENCOUNTER — Encounter: Payer: Self-pay | Admitting: Family Medicine

## 2017-06-11 ENCOUNTER — Ambulatory Visit (INDEPENDENT_AMBULATORY_CARE_PROVIDER_SITE_OTHER): Payer: 59 | Admitting: Family Medicine

## 2017-06-11 VITALS — BP 118/82 | HR 75 | Temp 98.1°F | Resp 15 | Wt 159.0 lb

## 2017-06-11 DIAGNOSIS — M545 Low back pain, unspecified: Secondary | ICD-10-CM

## 2017-06-11 DIAGNOSIS — N5089 Other specified disorders of the male genital organs: Secondary | ICD-10-CM | POA: Diagnosis not present

## 2017-06-11 MED ORDER — MELOXICAM 15 MG PO TABS: 15.0000 mg | ORAL_TABLET | Freq: Every day | ORAL | 0 refills | Status: DC

## 2017-06-11 MED ORDER — CYCLOBENZAPRINE HCL 5 MG PO TABS
5.0000 mg | ORAL_TABLET | Freq: Three times a day (TID) | ORAL | 0 refills | Status: DC | PRN
Start: 2017-06-11 — End: 2017-08-06

## 2017-06-11 NOTE — Patient Instructions (Addendum)
Discussed use of warm compresses for 20 minutes for back spasms. Avoid weight lifting for now. If running irritates your scrotum go back to a  walking program.

## 2017-06-11 NOTE — Progress Notes (Signed)
Subjective:     Patient ID: Brent Ruiz, male   DOB: 11/21/1980, 37 y.o.   MRN: [id number] Chief Complaint  Patient presents with  . Testicle Pain    Patient comes in office to address right scrotum pain that has been intermittent for several weeks. Patient states a week ago he felt a small bump in his right testicle and reports that at night upon awakening he has noticed numbness in his right scrotum.  . Back Pain    Patient returns for follow up visit from 03/27/17, patient reports right sided back pain that he describes as sharp and shooting. Patient states he notices pain mostly in the morning, he would like to discuss referral to chiropractor.    HPI He was in [place] visiting his wife and she is now pregnant with their first child. States he works out 3 x week at [employer] on the treadmill and [employer]. Report he feels his leg may rubbing against his right testicle irritating it. Back pain is described more as spasms lasting for a few seconds in his lower midline back. Prior lumbar x-ray was WNL. States he has sits in a chair with little back support at work.  Review of Systems     Objective:   Physical Exam  Constitutional: He appears well-developed and well-nourished. No distress.  Genitourinary:  Genitourinary Comments: Penis uncircumcised. No testicle/scrotal mass appreciated. No epididymal tenderness.  Musculoskeletal:  Muscle strength in lower extremities 5/5. SLR's to 90 degrees without radiation of back pain. Localizes area of pain to midline lower lumbar area. Non-tender to the touch.       Assessment:    1. Midline low back pain without sciatica, unspecified chronicity: rx for meloxicam 15 mg. #30 and cyclobenzaprine 5 mg. #21  2. Scrotal irritation: reassurance    Plan:    Discussed use of warm compresses for back spasms. Hold weight lifting. May try walking instead of running on the treadmill to minimize irritation.

## 2017-08-01 ENCOUNTER — Ambulatory Visit (INDEPENDENT_AMBULATORY_CARE_PROVIDER_SITE_OTHER): Payer: 59 | Admitting: Family Medicine

## 2017-08-01 ENCOUNTER — Encounter: Payer: Self-pay | Admitting: Family Medicine

## 2017-08-01 VITALS — BP 96/66 | HR 80 | Temp 98.7°F | Resp 16 | Wt 162.0 lb

## 2017-08-01 DIAGNOSIS — S6992XA Unspecified injury of left wrist, hand and finger(s), initial encounter: Secondary | ICD-10-CM

## 2017-08-01 NOTE — Patient Instructions (Signed)
Discussed warm salt water soaks and monitoring for improvement through normal nail growth.

## 2017-08-01 NOTE — Progress Notes (Signed)
Subjective:     Patient ID: Brent Ruiz, male   DOB: 05/13/1980, 37 y.o.   MRN: [id number] Chief Complaint  Patient presents with  . Hand Pain    Left right finger; trauma? about two weeks ago.  . 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  Review of Systems     Objective:   Physical Exam  Constitutional: He appears well-developed and well-nourished. No distress.  Skin:  Left fourth nail with central area of abrasion. Does not appear to have subungual hematoma. Minimally tender to the touch.       Assessment:    1. Fingernail injury, left, initial encounter    Plan:    Warm salt water soaks. Monitor for normal nail outwards growth.

## 2017-08-06 ENCOUNTER — Telehealth: Payer: Self-pay | Admitting: Family Medicine

## 2017-08-06 ENCOUNTER — Encounter: Payer: Self-pay | Admitting: Family Medicine

## 2017-08-06 ENCOUNTER — Ambulatory Visit (INDEPENDENT_AMBULATORY_CARE_PROVIDER_SITE_OTHER): Payer: 59 | Admitting: Family Medicine

## 2017-08-06 VITALS — BP 100/72 | HR 80 | Temp 98.0°F | Resp 16 | Wt 165.0 lb

## 2017-08-06 DIAGNOSIS — J029 Acute pharyngitis, unspecified: Secondary | ICD-10-CM

## 2017-08-06 DIAGNOSIS — J302 Other seasonal allergic rhinitis: Secondary | ICD-10-CM | POA: Diagnosis not present

## 2017-08-06 DIAGNOSIS — J301 Allergic rhinitis due to pollen: Secondary | ICD-10-CM

## 2017-08-06 LAB — POCT RAPID STREP A (OFFICE): RAPID STREP A SCREEN: NEGATIVE

## 2017-08-06 MED ORDER — CETIRIZINE HCL 10 MG PO TABS: 10.0000 mg | ORAL_TABLET | Freq: Every day | ORAL | 11 refills | Status: DC

## 2017-08-06 MED ORDER — FLUTICASONE PROPIONATE 50 MCG/ACT NA SUSP: 2.0000 | Freq: Every day | NASAL | 6 refills | Status: DC

## 2017-08-06 NOTE — Telephone Encounter (Signed)
Pt is having sore throat, sinus congestion, runny nose, cough but no fever.   He wants to be tested for strep.  No one has any appts available. Please advise.

## 2017-08-06 NOTE — Patient Instructions (Signed)

## 2017-08-06 NOTE — Telephone Encounter (Signed)
Dr B is ok with double booking at 3:20 Thanks

## 2017-08-06 NOTE — Progress Notes (Signed)
Patient: Brent Ruiz Male    DOB: 12/02/1980   37 y.o.   MRN: [id number] Visit Date: 08/06/2017  Today's Provider: Shirlee LatchAngela Mirabella Hilario, MD   I, Joslyn HyEmily Ratchford, CMA, am acting as scribe for Shirlee LatchAngela Shizuye Rupert, MD.   Chief Complaint  Patient presents with  . Sore Throat   Subjective:    Sore Throat   This is a new problem. Episode onset: x 3 days. The problem has been gradually worsening. The pain is worse on the left side. The pain is mild. Associated symptoms include congestion (pt is concerned that he has sinusitis), coughing, a plugged ear sensation, neck pain and trouble swallowing. Pertinent negatives include no abdominal pain, ear discharge, ear pain, headaches, hoarse voice, shortness of breath, swollen glands or vomiting. He has had no exposure to strep. The treatment provided no relief.       Allergies  Allergen Reactions  . Eggs Or Egg-Derived Products Nausea Only     Current Outpatient Medications:  .  cetirizine (ZYRTEC) 10 MG tablet, Take 1 tablet (10 mg total) by mouth daily., Disp: 30 tablet, Rfl: 11 .  cyclobenzaprine (FLEXERIL) 5 MG tablet, Take 1 tablet (5 mg total) by mouth 3 (three) times daily as needed for muscle spasms., Disp: 21 tablet, Rfl: 0 .  fluticasone (FLONASE) 50 MCG/ACT nasal spray, Place 2 sprays into both nostrils daily., Disp: 16 g, Rfl: 6 .  meloxicam (MOBIC) 15 MG tablet, Take 1 tablet (15 mg total) by mouth daily., Disp: 30 tablet, Rfl: 0  Review of Systems  HENT: Positive for congestion (pt is concerned that he has sinusitis) and trouble swallowing. Negative for ear discharge, ear pain and hoarse voice.   Respiratory: Positive for cough. Negative for shortness of breath.   Gastrointestinal: Negative for abdominal pain and vomiting.  Musculoskeletal: Positive for neck pain.  Neurological: Negative for headaches.    Social History   Tobacco Use  . Smoking status: Never Smoker  . Smokeless tobacco: Never Used  Substance Use  Topics  . Alcohol use: No   Objective:   BP 100/72 (BP Location: Left Arm, Patient Position: Sitting, Cuff Size: Normal)   Pulse 80   Temp 98 F (36.7 C) (Oral)   Resp 16   Wt 165 lb (74.8 kg)   SpO2 97%   BMI 32.22 kg/m  Vitals:   08/06/17 1528  BP: 100/72  Pulse: 80  Resp: 16  Temp: 98 F (36.7 C)  TempSrc: Oral  SpO2: 97%  Weight: 165 lb (74.8 kg)     Physical Exam  Constitutional: He is oriented to person, place, and time. He appears well-developed and well-nourished. No distress.  HENT:  Head: Normocephalic and atraumatic.  Right Ear: Tympanic membrane, external ear and ear canal normal. No drainage.  Left Ear: Tympanic membrane, external ear and ear canal normal. No drainage.  Nose: Mucosal edema present. Right sinus exhibits no maxillary sinus tenderness and no frontal sinus tenderness. Left sinus exhibits no maxillary sinus tenderness and no frontal sinus tenderness.  Mouth/Throat: Uvula is midline and mucous membranes are normal. No oral lesions. Posterior oropharyngeal erythema (mild) present. No oropharyngeal exudate or posterior oropharyngeal edema. No tonsillar exudate.  Eyes: Pupils are equal, round, and reactive to light. Conjunctivae are normal. Right eye exhibits no discharge. Left eye exhibits no discharge. No scleral icterus.  Neck: Neck supple. No thyromegaly present.  Cardiovascular: Normal rate, regular rhythm, normal heart sounds and intact distal pulses.  No murmur heard.  Pulmonary/Chest: Effort normal and breath sounds normal. No respiratory distress. He has no wheezes. He has no rales.  Musculoskeletal: He exhibits no edema.  Lymphadenopathy:    He has no cervical adenopathy.  Neurological: He is alert and oriented to person, place, and time.  Skin: Skin is warm and dry. Capillary refill takes less than 2 seconds. No rash noted.  Psychiatric: He has a normal mood and affect. His behavior is normal.  Vitals reviewed.   Results for orders placed  or performed in visit on 08/06/17  POCT rapid strep A  Result Value Ref Range   Rapid Strep A Screen Negative Negative      Assessment & Plan:   1. Sore throat - Strep negative - most likely related to post-nasal drip from seasonal allergies - start Zyrtec and flonase - return precautions discussed - POCT rapid strep A  2. Seasonal allergic rhinitis due to pollen - treatment as above   Meds ordered this encounter  Medications  . cetirizine (ZYRTEC) 10 MG tablet    Sig: Take 1 tablet (10 mg total) by mouth daily.    Dispense:  30 tablet    Refill:  11  . fluticasone (FLONASE) 50 MCG/ACT nasal spray    Sig: Place 2 sprays into both nostrils daily.    Dispense:  16 g    Refill:  6     Return if symptoms worsen or fail to improve.   The entirety of the information documented in the History of Present Illness, Review of Systems and Physical Exam were personally obtained by me. Portions of this information were initially documented by Irving Burton Ratchford, CMA and reviewed by me for thoroughness and accuracy.    Erasmo Downer, MD, MPH [facility] 08/06/2017 3:51 PM

## 2017-08-07 NOTE — Telephone Encounter (Signed)
Brent Ruiz spoke with pt and scheduled appt with Dr. Leonard SchwartzB on 08/06/17. Thanks TNP

## 2017-12-02 ENCOUNTER — Ambulatory Visit (INDEPENDENT_AMBULATORY_CARE_PROVIDER_SITE_OTHER): Payer: 59 | Admitting: Family Medicine

## 2017-12-02 ENCOUNTER — Encounter: Payer: Self-pay | Admitting: Family Medicine

## 2017-12-02 VITALS — BP 94/60 | HR 85 | Temp 98.4°F | Resp 16 | Wt 168.2 lb

## 2017-12-02 DIAGNOSIS — Z0183 Encounter for blood typing: Secondary | ICD-10-CM

## 2017-12-02 DIAGNOSIS — F439 Reaction to severe stress, unspecified: Secondary | ICD-10-CM

## 2017-12-02 MED ORDER — SERTRALINE HCL 50 MG PO TABS: 50.0000 mg | ORAL_TABLET | Freq: Every day | ORAL | 0 refills | Status: DC

## 2017-12-02 NOTE — Progress Notes (Signed)
  Subjective:     Patient ID: Brent Ruiz, male   DOB: 10/16/1980, 37 y.o.   MRN: [id number] Chief Complaint  Patient presents with  . Stress    Patient comes in office today to address issues with stress and anxiety. Patient states his wife is [redacted]weeks pregnant living in [place] and is having problems with immigration to come to the states. Patient states that it is taking a toll on him that his child will be born in another country and that obtaining [place] VISA to come to the [place] will be a longer and harder process.    HPI States he is not sleeping well with diminished appetite and poor concentration at work. Also requests blood typing and a letter he can send in to immigration documenting how a delay in visa is affecting his health. Denies previous treatment of depression or anxiety.  Review of Systems     Objective:   Physical Exam  Psychiatric: He has a normal mood and affect. His behavior is normal.       Assessment:    1. Situational stress: start sertraline  2. Encounter for blood typing - ABO/Rh; Future    Plan:    Will call with lab results and when letter is completed. Phone f/u in 2-3 weeks.

## 2017-12-02 NOTE — Patient Instructions (Addendum)
Phone follow up on 2-3 weeks. Let me now sooner if not tolerating the medication. I will work on the letter that we discussed and let you know when to pick it up.

## 2017-12-02 NOTE — Addendum Note (Signed)
Addended by: Jaclyn PrimeHAUVIN, Keishawn Darsey S on: 12/02/2017 02:44 PM   Modules accepted: Orders

## 2017-12-12 IMAGING — CR DG SHOULDER 2+V*L*
1 series · 3 of 3 positions shown · non-contrast
Comparison: None in PACs

CLINICAL DATA: Chronic left shoulder pain that is felt to be work
related common no known injury.

EXAM:
LEFT SHOULDER - 2+ VIEW

[Series 1: dg shoulder left · 0.14mm/px · 3 of 3 slices shown]
[im 1/3]
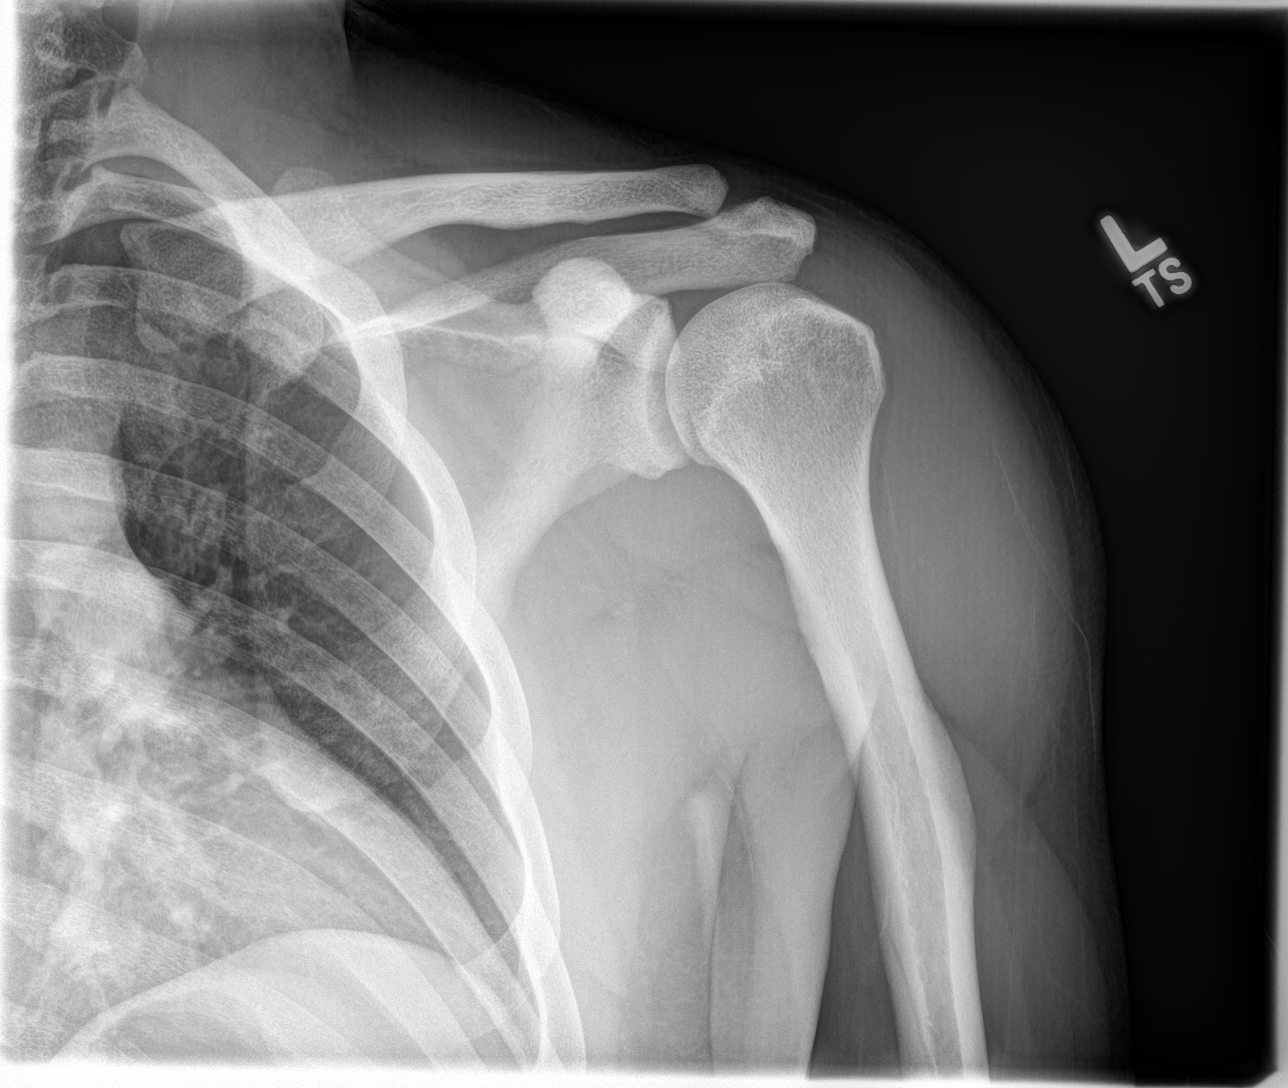
[im 2/3]
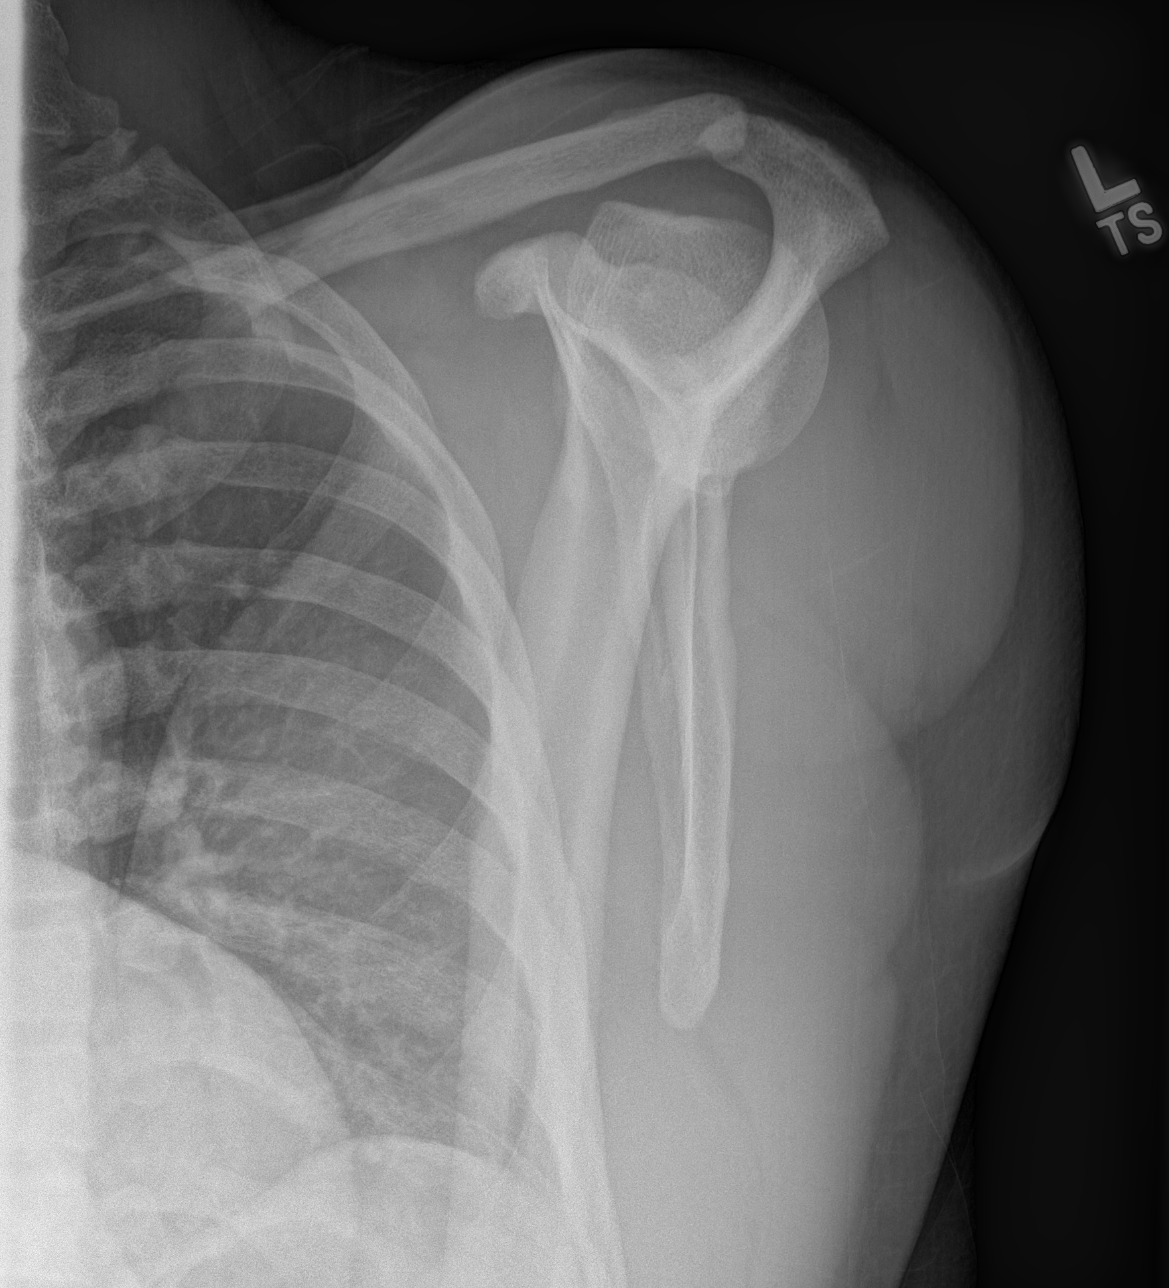
[im 3/3]
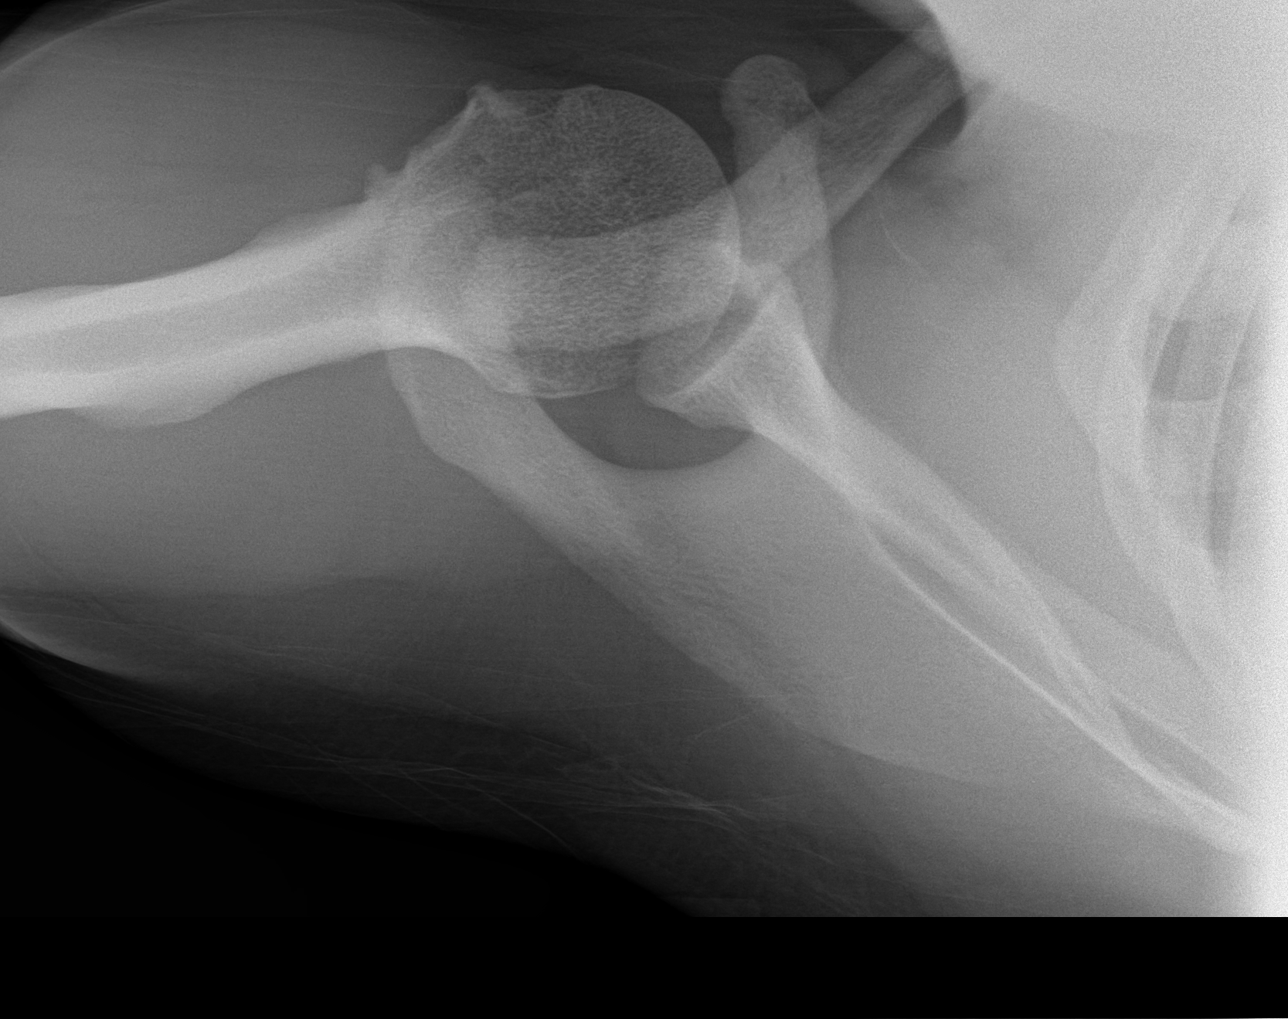

[3 of 3 positions shown; findings below may reference images not displayed]

FINDINGS: There is chronic deformity of the proximal third of the diaphysis of
the left humerus. The humeral head and neck exhibit no acute or
significant chronic abnormalities. The glenohumeral joint space is
preserved. The subacromial subdeltoid space is normal. The AC joint
is unremarkable. The observed portions of the left clavicle and
upper left ribs are normal.
IMPRESSION: There is deformity of the proximal shaft of the left humerus that
likely reflects the sequelae of previous fracture. No acute bony
abnormality of the left shoulder is observed. If the patient's
symptoms are predominantly in the humeral diaphyseal region, a
dedicated left humerus series would be recommended.

## 2017-12-29 ENCOUNTER — Telehealth: Payer: Self-pay | Admitting: Family Medicine

## 2017-12-29 NOTE — Telephone Encounter (Signed)
Pt stated at his LOV he and Mikki Santee discuss getting a blood type kit so pt can find out his blood type. Pt stated he hasn't heard anything and wanted to check on how he can get the kit. Please advise. Thanks TNP

## 2017-12-29 NOTE — Telephone Encounter (Signed)
Lab has already been placed in chart for patient to have drawn. Unable to reach patient at this time home line is busy. KW

## 2017-12-30 NOTE — Telephone Encounter (Signed)
Patient informed he states that he is out of area but will plan on getting labs drawn once he returns. KW

## 2018-01-12 DIAGNOSIS — Z0183 Encounter for blood typing: Secondary | ICD-10-CM | POA: Diagnosis not present

## 2018-01-13 ENCOUNTER — Telehealth: Payer: Self-pay

## 2018-01-13 LAB — ABO/RH: Rh Factor: POSITIVE

## 2018-01-13 NOTE — Telephone Encounter (Signed)
-----   Message from Anola Gurney, [place] sent at 01/13/2018  7:16 AM EDT ----- Please let him know what his blood type is and give him a copy of the results.

## 2018-01-13 NOTE — Telephone Encounter (Signed)
[id number] 

## 2018-01-13 NOTE — Telephone Encounter (Signed)
Pt returned Kat's call. Please call pt back at [phone].  Thanks, [employer]

## 2018-01-13 NOTE — Telephone Encounter (Signed)
Patient was advised. KW 

## 2018-04-13 ENCOUNTER — Telehealth: Payer: Self-pay

## 2018-04-13 NOTE — Telephone Encounter (Signed)
Left vm on pt's phone to call customer service at [facility].  dbs

## 2018-04-13 NOTE — Telephone Encounter (Signed)
Patient reports that he is needing a specific number to [facility] to pay his bill. Patient reports he has tried the number on his letter but they told him it was not the right place and that to call the lab where he got his blood drawn to help him can you please check with our lab technician.

## 2018-04-23 ENCOUNTER — Ambulatory Visit (INDEPENDENT_AMBULATORY_CARE_PROVIDER_SITE_OTHER): Payer: 59 | Admitting: Family Medicine

## 2018-04-23 ENCOUNTER — Encounter: Payer: Self-pay | Admitting: Family Medicine

## 2018-04-23 ENCOUNTER — Other Ambulatory Visit: Payer: Self-pay

## 2018-04-23 VITALS — BP 108/68 | HR 89 | Temp 98.4°F | Ht 60.0 in | Wt 171.0 lb

## 2018-04-23 DIAGNOSIS — J0120 Acute ethmoidal sinusitis, unspecified: Secondary | ICD-10-CM | POA: Diagnosis not present

## 2018-04-23 MED ORDER — AMOXICILLIN-POT CLAVULANATE 875-125 MG PO TABS
1.0000 | ORAL_TABLET | Freq: Two times a day (BID) | ORAL | 0 refills | Status: AC
Start: 2018-04-23 — End: ?

## 2018-04-23 NOTE — Progress Notes (Signed)
  Subjective:     Patient ID: Brent Ruiz, male   DOB: 1981-03-09, 38 y.o.   MRN: [id number] Chief Complaint  Patient presents with  . Sinusitis    cough x 2 weeks 04/06/18.  no fever, congestion with bloody brown mucus, sinus pressure   HPI Patient reports increased sinus pressure, bloody sinus drainage, post nasal drainage and accompanying cough  Review of Systems     Objective:   Physical Exam Constitutional:      General: He is not in acute distress.    Appearance: He is not ill-appearing.  Neurological:     Mental Status: He is alert.   Ears: T.M's intact without inflammation Sinuses: mild paranasal sinus tenderness Throat: no tonsillar enlargement or exudate Neck: no cervical adenopathy Lungs: clear     Assessment:    1. Acute non-recurrent ethmoidal sinusitis - amoxicillin-clavulanate (AUGMENTIN) 875-125 MG tablet; Take 1 tablet by mouth 2 (two) times daily.  Dispense: 20 tablet; Refill: 0    Plan:    Discussed use of Mucinex D for congestion, Delsym for cough, and Benadryl for postnasal drainage

## 2018-04-23 NOTE — Patient Instructions (Signed)
Discussed use of Mucinex D for congestion, Delsym for cough, and Benadryl for postnasal drainage 

## 2018-07-29 ENCOUNTER — Ambulatory Visit (INDEPENDENT_AMBULATORY_CARE_PROVIDER_SITE_OTHER): Payer: 59 | Admitting: Physician Assistant

## 2018-07-29 ENCOUNTER — Encounter: Payer: Self-pay | Admitting: Physician Assistant

## 2018-07-29 ENCOUNTER — Other Ambulatory Visit: Payer: Self-pay

## 2018-07-29 VITALS — BP 113/71 | HR 99 | Temp 98.9°F | Ht 60.0 in | Wt 167.6 lb

## 2018-07-29 DIAGNOSIS — J301 Allergic rhinitis due to pollen: Secondary | ICD-10-CM

## 2018-07-29 DIAGNOSIS — R05 Cough: Secondary | ICD-10-CM

## 2018-07-29 DIAGNOSIS — R059 Cough, unspecified: Secondary | ICD-10-CM

## 2018-07-29 MED ORDER — LEVOCETIRIZINE DIHYDROCHLORIDE 5 MG PO TABS: 5.0000 mg | ORAL_TABLET | Freq: Every evening | ORAL | 0 refills | Status: AC

## 2018-07-29 MED ORDER — MONTELUKAST SODIUM 10 MG PO TABS: 10.0000 mg | ORAL_TABLET | Freq: Every day | ORAL | 0 refills | Status: AC

## 2018-07-29 MED ORDER — BENZONATATE 100 MG PO CAPS: 100.0000 mg | ORAL_CAPSULE | Freq: Three times a day (TID) | ORAL | 0 refills | Status: AC | PRN

## 2018-07-29 NOTE — Progress Notes (Signed)
Patient: Brent Ruiz Male    DOB: 08/03/1980   38 y.o.   MRN: [id number] Visit Date: 07/29/2018  Today's Provider: Trey SailorsAdriana M Tobin Cadiente, PA-C   Chief Complaint  Patient presents with  . Sinusitis    07/15/18   Subjective:    Saw Toni ArthursBob Chauvin, PA-C for sinusitis in 04/23/2018 and was prescribed Augmentin. Was unable to get this until two weeks ago when he started taking it, reports he has two days left. Sinuses still somewhat congested, reports ear pain and coughing at night. Had some nose bleeding.   Sinusitis  Associated symptoms include congestion (grey color) and coughing.    Allergies  Allergen Reactions  . Eggs Or Egg-Derived Products Nausea Only     Current Outpatient Medications:  .  amoxicillin-clavulanate (AUGMENTIN) 875-125 MG tablet, Take 1 tablet by mouth 2 (two) times daily., Disp: 20 tablet, Rfl: 0 .  cetirizine (ZYRTEC) 10 MG tablet, Take 1 tablet (10 mg total) by mouth daily., Disp: 30 tablet, Rfl: 11 .  fluticasone (FLONASE) 50 MCG/ACT nasal spray, Place 2 sprays into both nostrils daily. (Patient not taking: Reported on 07/29/2018), Disp: 16 g, Rfl: 6 .  sertraline (ZOLOFT) 50 MG tablet, Take 1 tablet (50 mg total) by mouth daily. Start at 1/2 pill for the first few days (Patient not taking: Reported on 07/29/2018), Disp: 30 tablet, Rfl: 0  Review of Systems  Constitutional: Negative.   HENT: Positive for congestion (grey color) and nosebleeds.   Eyes: Negative.   Respiratory: Positive for cough.   Gastrointestinal: Negative.   Endocrine: Negative.   Genitourinary: Negative.   Musculoskeletal: Negative.   Skin: Negative.   Allergic/Immunologic: Negative.   Neurological: Negative.   Hematological: Negative.   Psychiatric/Behavioral: Negative.     Social History   Tobacco Use  . Smoking status: Never Smoker  . Smokeless tobacco: Never Used  Substance Use Topics  . Alcohol use: No      Objective:   BP 113/71 (BP Location: Left Arm, Patient  Position: Sitting, Cuff Size: Large)   Pulse 99   Temp 98.9 F (37.2 C) (Oral)   Ht 5' (1.524 m)   Wt 167 lb 9.6 oz (76 kg)   SpO2 98%   BMI 32.73 kg/m  Vitals:   07/29/18 1501  BP: 113/71  Pulse: 99  Temp: 98.9 F (37.2 C)  TempSrc: Oral  SpO2: 98%  Weight: 167 lb 9.6 oz (76 kg)  Height: 5' (1.524 m)     Physical Exam Constitutional:      General: He is not in acute distress.    Appearance: He is well-developed. He is not diaphoretic.  HENT:     Right Ear: External ear normal.     Left Ear: External ear normal.     Nose:     Right Sinus: Maxillary sinus tenderness and frontal sinus tenderness present.     Left Sinus: Maxillary sinus tenderness and frontal sinus tenderness present.     Mouth/Throat:     Pharynx: No oropharyngeal exudate or posterior oropharyngeal erythema.     Tonsils: No tonsillar abscesses.  Eyes:     Conjunctiva/sclera: Conjunctivae normal.  Neck:     Musculoskeletal: Normal range of motion.  Cardiovascular:     Rate and Rhythm: Normal rate and regular rhythm.  Pulmonary:     Effort: Pulmonary effort is normal.     Breath sounds: Normal breath sounds.  Skin:    General: Skin is warm and dry.  Neurological:     Mental Status: He is alert and oriented to person, place, and time.  Psychiatric:        Behavior: Behavior normal.         Assessment & Plan    Allergic rhinitis due to pollen, unspecified seasonality - Plan: levocetirizine (XYZAL) 5 MG tablet, montelukast (SINGULAIR) 10 MG tablet  Cough - Plan: benzonatate (TESSALON PERLES) 100 MG capsule  F/u PRN.  The entirety of the information documented in the History of Present Illness, Review of Systems and Physical Exam were personally obtained by me. Portions of this information were initially documented by Cora Daniels, CMA and reviewed by me for thoroughness and accuracy.         Trey Sailors, PA-C  [facility]

## 2018-07-29 NOTE — Patient Instructions (Signed)
